# Patient Record
Sex: Male | Born: 1970 | Race: White | Hispanic: No | Marital: Single
Health system: Southern US, Community
[De-identification: ages and names within clinical notes are randomized; demographics above are authoritative.]

## PROBLEM LIST (undated history)

## (undated) DIAGNOSIS — K219 Gastro-esophageal reflux disease without esophagitis: Secondary | ICD-10-CM

## (undated) DIAGNOSIS — G4733 Obstructive sleep apnea (adult) (pediatric): Secondary | ICD-10-CM

## (undated) DIAGNOSIS — E785 Hyperlipidemia, unspecified: Secondary | ICD-10-CM

## (undated) HISTORY — PX: HEMORROIDECTOMY: SUR656

---

## 2019-05-28 ENCOUNTER — Emergency Department: Payer: 59

## 2019-05-28 ENCOUNTER — Other Ambulatory Visit: Payer: Self-pay

## 2019-05-28 ENCOUNTER — Emergency Department
Admission: EM | Admit: 2019-05-28 | Discharge: 2019-05-28 | Disposition: A | Discharge status: Home / Self Care | Payer: 59 | Attending: Student in an Organized Health Care Education/Training Program | Admitting: Student in an Organized Health Care Education/Training Program

## 2019-05-28 ENCOUNTER — Encounter: Payer: Self-pay | Admitting: Emergency Medicine

## 2019-05-28 DIAGNOSIS — R0602 Shortness of breath: Secondary | ICD-10-CM | POA: Diagnosis not present

## 2019-05-28 HISTORY — DX: Obstructive sleep apnea (adult) (pediatric): G47.33

## 2019-05-28 HISTORY — DX: Hyperlipidemia, unspecified: E78.5

## 2019-05-28 HISTORY — DX: Gastro-esophageal reflux disease without esophagitis: K21.9

## 2019-05-28 LAB — COMPREHENSIVE METABOLIC PANEL
ALT: 36 U/L (ref 0–44)
AST: 31 U/L (ref 15–41)
Albumin: 4.4 g/dL (ref 3.5–5.0)
Alkaline Phosphatase: 77 U/L (ref 38–126)
Anion gap: 9 (ref 5–15)
BUN: 12 mg/dL (ref 6–20)
CO2: 28 mmol/L (ref 22–32)
Calcium: 9.2 mg/dL (ref 8.9–10.3)
Chloride: 101 mmol/L (ref 98–111)
Creatinine, Ser: 0.82 mg/dL (ref 0.61–1.24)
GFR calc Af Amer: >60 mL/min (ref >60)
GFR calc non Af Amer: >60 mL/min (ref >60)
Glucose, Bld: 102 mg/dL — ABNORMAL HIGH (ref 70–99)
Potassium: 4.2 mmol/L (ref 3.5–5.1)
Sodium: 138 mmol/L (ref 135–145)
Total Bilirubin: 1.4 mg/dL — ABNORMAL HIGH (ref 0.3–1.2)
Total Protein: 8.2 g/dL — ABNORMAL HIGH (ref 6.5–8.1)

## 2019-05-28 LAB — URINALYSIS, COMPLETE (UACMP) WITH MICROSCOPIC
Bacteria, UA: NONE SEEN
Bilirubin Urine: NEGATIVE
Glucose, UA: NEGATIVE mg/dL
Hgb urine dipstick: NEGATIVE
Ketones, ur: NEGATIVE mg/dL
Leukocytes,Ua: NEGATIVE
Nitrite: NEGATIVE
Protein, ur: NEGATIVE mg/dL
Specific Gravity, Urine: 1.002 — ABNORMAL LOW (ref 1.005–1.030)
Squamous Epithelial / HPF: NONE SEEN (ref 0–5)
WBC, UA: NONE SEEN WBC/hpf (ref 0–5)
pH: 6 (ref 5.0–8.0)

## 2019-05-28 LAB — CBC WITH DIFFERENTIAL/PLATELET
Abs Immature Granulocytes: 0.03 10*3/uL (ref 0.00–0.07)
Basophils Absolute: 0 10*3/uL (ref 0.0–0.1)
Basophils Relative: 0 %
Eosinophils Absolute: 0.2 10*3/uL (ref 0.0–0.5)
Eosinophils Relative: 2 %
HCT: 48 % (ref 39.0–52.0)
Hemoglobin: 16.5 g/dL (ref 13.0–17.0)
Immature Granulocytes: 0 %
Lymphocytes Relative: 32 %
Lymphs Abs: 2.3 10*3/uL (ref 0.7–4.0)
MCH: 31.1 pg (ref 26.0–34.0)
MCHC: 34.4 g/dL (ref 30.0–36.0)
MCV: 90.4 fL (ref 80.0–100.0)
Monocytes Absolute: 0.7 10*3/uL (ref 0.1–1.0)
Monocytes Relative: 10 %
Neutro Abs: 4.1 10*3/uL (ref 1.7–7.7)
Neutrophils Relative %: 56 %
Platelets: 249 10*3/uL (ref 150–400)
RBC: 5.31 MIL/uL (ref 4.22–5.81)
RDW: 12.1 % (ref 11.5–15.5)
WBC: 7.3 10*3/uL (ref 4.0–10.5)
nRBC: 0 % (ref 0.0–0.2)

## 2019-05-28 LAB — TROPONIN I (HIGH SENSITIVITY)
Troponin I (High Sensitivity): 3 ng/L (ref <18)
Troponin I (High Sensitivity): 4 ng/L (ref <18)

## 2019-05-28 IMAGING — DX PORTABLE CHEST - 1 VIEW
1 series · 1 of 1 positions shown · non-contrast
Comparison: None.

CLINICAL DATA: Shortness of breath

EXAM:
PORTABLE CHEST 1 VIEW

[chest ap]
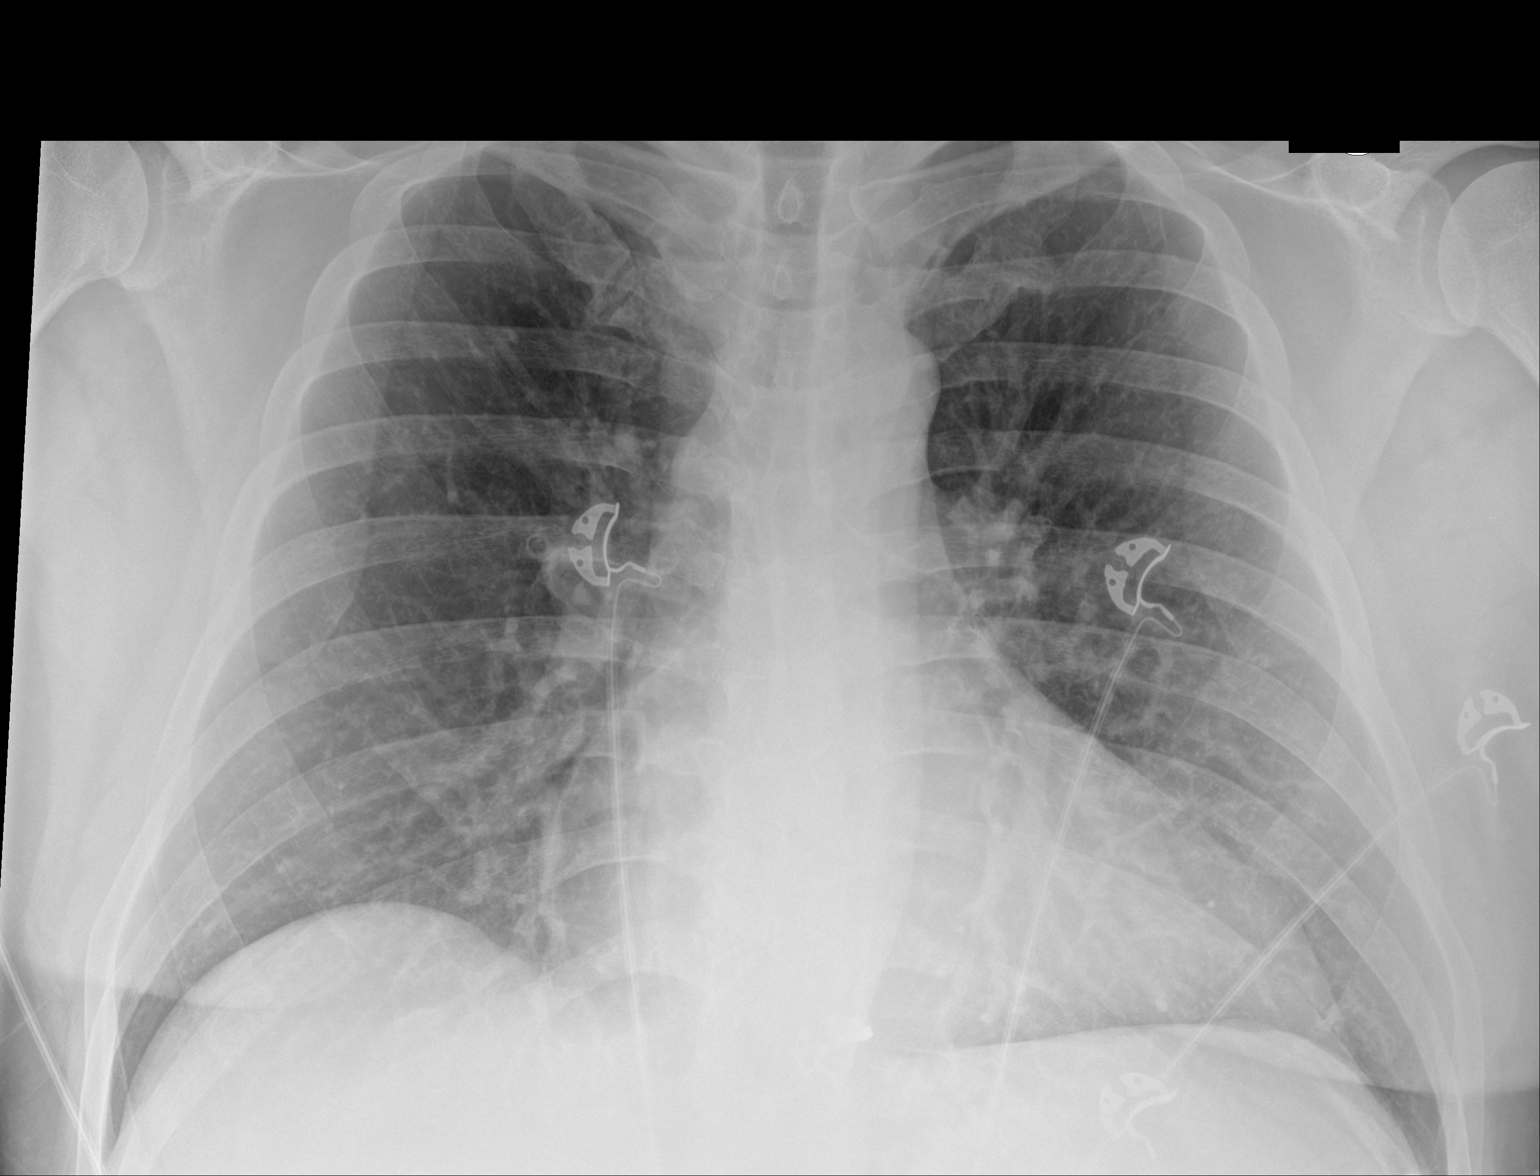

[1 of 1 positions shown; findings below may reference images not displayed]

FINDINGS: Normal heart size and mediastinal contours when accounting for
probable fat pad along the left heart border. No acute infiltrate or
edema. No effusion or pneumothorax. No acute osseous findings.
IMPRESSION: No active disease.

## 2019-05-28 NOTE — ED Provider Notes (Signed)
[facility] Emergency Department Provider Note    First MD Initiated Contact with Patient 05/28/19 [phone]     (approximate)  I have reviewed the triage vital signs and the nursing notes.   HISTORY  Chief Complaint Shortness of Breath    HPI Stephen Tapia is a 48 y.o. male history GERD hyperlipidemia and obstructive sleep apnea presents the ER for evaluation of shortness of breath that woke him from sleep.  States that he is traveling to the area to visit family he was staying overnight at a hotel.  Was using his CPAP as he usually does.  There were no alarm sounding.  States he did he will follow this before going to bed and does have a history of reflux waking him up in the middle the night.  He denied any chest pain or pressure.  No nausea or vomiting.  No fevers.  States that he felt like his heart was pounding when he woke up.  Denied any diaphoresis.    Past Medical History:  Diagnosis Date  . GERD (gastroesophageal reflux disease)   . Hyperlipidemia   . OSA (obstructive sleep apnea)    No family history on file.  There are no active problems to display for this patient.     Prior to Admission medications   Not on File    Allergies Patient has no allergy information on record.    Social History Social History   Tobacco Use  . Smoking status: Not on file  Substance Use Topics  . Alcohol use: Not on file  . Drug use: Not on file    Review of Systems Patient denies headaches, rhinorrhea, blurry vision, numbness, shortness of breath, chest pain, edema, cough, abdominal pain, nausea, vomiting, diarrhea, dysuria, fevers, rashes or hallucinations unless otherwise stated above in HPI. ____________________________________________   PHYSICAL EXAM:  VITAL SIGNS: Vitals:   05/28/19 0615 05/28/19 0630  BP:  136/87  Pulse: 83 75  Resp: 20 (!) 21  Temp:    SpO2: 96% 96%    Constitutional: Alert and oriented.  Eyes: Conjunctivae are  normal.  Head: Atraumatic. Nose: No congestion/rhinnorhea. Mouth/Throat: Mucous membranes are moist.   Neck: No stridor. Painless ROM.  Cardiovascular: Normal rate, regular rhythm. Grossly normal heart sounds.  Good peripheral circulation. Respiratory: Normal respiratory effort.  No retractions. Lungs CTAB. Gastrointestinal: Soft and nontender. No distention. No abdominal bruits. No CVA tenderness. Genitourinary: deferred Musculoskeletal: No lower extremity tenderness nor edema.  No joint effusions. Neurologic:  Normal speech and language. No gross focal neurologic deficits are appreciated. No facial droop Skin:  Skin is warm, dry and intact. No rash noted. Psychiatric: Mood and affect are normal. Speech and behavior are normal.  ____________________________________________   LABS (all labs ordered are listed, but only abnormal results are displayed)  Results for orders placed or performed during the hospital encounter of 05/28/19 (from the past 24 hour(s))  CBC with Differential/Platelet     Status: None   Collection Time: 05/28/19  4:23 AM  Result Value Ref Range   WBC 7.3 4.0 - 10.5 K/uL   RBC 5.31 4.22 - 5.81 MIL/uL   Hemoglobin 16.5 13.0 - 17.0 g/dL   HCT 96.048.0 45.439.0 - 09.852.0 %   MCV 90.4 80.0 - 100.0 fL   MCH 31.1 26.0 - 34.0 pg   MCHC 34.4 30.0 - 36.0 g/dL   RDW 11.912.1 14.711.5 - 82.915.5 %   Platelets 249 150 - 400 K/uL   nRBC 0.0  0.0 - 0.2 %   Neutrophils Relative % 56 %   Neutro Abs 4.1 1.7 - 7.7 K/uL   Lymphocytes Relative 32 %   Lymphs Abs 2.3 0.7 - 4.0 K/uL   Monocytes Relative 10 %   Monocytes Absolute 0.7 0.1 - 1.0 K/uL   Eosinophils Relative 2 %   Eosinophils Absolute 0.2 0.0 - 0.5 K/uL   Basophils Relative 0 %   Basophils Absolute 0.0 0.0 - 0.1 K/uL   Immature Granulocytes 0 %   Abs Immature Granulocytes 0.03 0.00 - 0.07 K/uL  Comprehensive metabolic panel     Status: Abnormal   Collection Time: 05/28/19  4:23 AM  Result Value Ref Range   Sodium 138 135 - 145 mmol/L    Potassium 4.2 3.5 - 5.1 mmol/L   Chloride 101 98 - 111 mmol/L   CO2 28 22 - 32 mmol/L   Glucose, Bld 102 (H) 70 - 99 mg/dL   BUN 12 6 - 20 mg/dL   Creatinine, Ser 1.610.82 0.61 - 1.24 mg/dL   Calcium 9.2 8.9 - 09.610.3 mg/dL   Total Protein 8.2 (H) 6.5 - 8.1 g/dL   Albumin 4.4 3.5 - 5.0 g/dL   AST 31 15 - 41 U/L   ALT 36 0 - 44 U/L   Alkaline Phosphatase 77 38 - 126 U/L   Total Bilirubin 1.4 (H) 0.3 - 1.2 mg/dL   GFR calc non Af Amer >60 >60 mL/min   GFR calc Af Amer >60 >60 mL/min   Anion gap 9 5 - 15  Troponin I (High Sensitivity)     Status: None   Collection Time: 05/28/19  4:23 AM  Result Value Ref Range   Troponin I (High Sensitivity) 3 <18 ng/L  Urinalysis, Complete w Microscopic     Status: Abnormal   Collection Time: 05/28/19  4:23 AM  Result Value Ref Range   Color, Urine STRAW (A) YELLOW   APPearance CLEAR (A) CLEAR   Specific Gravity, Urine 1.002 (L) 1.005 - 1.030   pH 6.0 5.0 - 8.0   Glucose, UA NEGATIVE NEGATIVE mg/dL   Hgb urine dipstick NEGATIVE NEGATIVE   Bilirubin Urine NEGATIVE NEGATIVE   Ketones, ur NEGATIVE NEGATIVE mg/dL   Protein, ur NEGATIVE NEGATIVE mg/dL   Nitrite NEGATIVE NEGATIVE   Leukocytes,Ua NEGATIVE NEGATIVE   WBC, UA NONE SEEN 0 - 5 WBC/hpf   Bacteria, UA NONE SEEN NONE SEEN   Squamous Epithelial / LPF NONE SEEN 0 - 5  Troponin I (High Sensitivity)     Status: None   Collection Time: 05/28/19  6:23 AM  Result Value Ref Range   Troponin I (High Sensitivity) 4 <18 ng/L   ____________________________________________  EKG My review and personal interpretation at Time: 4:20   Indication: sob  Rate: 95  Rhythm: sinus Axis: normal Other: iRBBB,, no stemi, nonspecific st abn ____________________________________________  RADIOLOGY  I personally reviewed all radiographic images ordered to evaluate for the above acute complaints and reviewed radiology reports and findings.  These findings were personally discussed with the patient.  Please see  medical record for radiology report.  ____________________________________________   PROCEDURES  Procedure(s) performed:  Procedures    Critical Care performed: no ____________________________________________   INITIAL IMPRESSION / ASSESSMENT AND PLAN / ED COURSE  Pertinent labs & imaging results that were available during my care of the patient were reviewed by me and considered in my medical decision making (see chart for details).   DDX: dysrhythmia, chf, acs, copd,  bronchitis, reflux  Stephen Tapia is a 48 y.o. who presents to the ED with symptoms as described above.  Patient well-appearing in no acute distress.  Have low suspicion for ACS.Marland Kitchen  Abdominal exam is soft and benign.  Will place on cardiac monitor a.  Will order serial enzymes.  He is otherwise low risk by heart score.  Clinical Course as of May 27 704  Fri May 28, 2019  0624 Initial troponin is normal.  Patient is low risk by Wells criteria and is PERC negative thus far work-up is reassuring.  Will repeat troponin.  Not having any evidence of dysrhythmia on telemetry.   [PR]  [phone] Patient remains hemodynamically stable.  No hypoxia.  No dysrhythmia.  Initial troponin negative.  We will follow-up on repeat troponin.  If this is negative anticipate patient will be appropriate for outpatient follow-up.   [PR]    Clinical Course User Index [PR] Merlyn Lot, MD    The patient was evaluated in Emergency Department today for the symptoms described in the history of present illness. He/she was evaluated in the context of the global COVID-19 pandemic, which necessitated consideration that the patient might be at risk for infection with the SARS-CoV-2 virus that causes COVID-19. Institutional protocols and algorithms that pertain to the evaluation of patients at risk for COVID-19 are in a state of rapid change based on information released by regulatory bodies including the CDC and federal and state organizations. These  policies and algorithms were followed during the patient's care in the ED.  As part of my medical decision making, I reviewed the following data within the [facility] notes reviewed and incorporated, Labs reviewed, notes from prior ED visits and [place] Controlled Substance Database   ____________________________________________   FINAL CLINICAL IMPRESSION(S) / ED DIAGNOSES  Final diagnoses:  SOB (shortness of breath)      NEW MEDICATIONS STARTED DURING THIS VISIT:  New Prescriptions   No medications on file     Note:  This document was prepared using Dragon voice recognition software and may include unintentional dictation errors.    Merlyn Lot, MD 05/28/19 [id number]

## 2019-05-28 NOTE — Discharge Instructions (Signed)
Please follow-up with your primary care physician.  Seek medical care if you develop any shortness of breath chest pain or pressure or for any additional questions or concerns.

## 2019-05-28 NOTE — ED Notes (Signed)
Called lab to check on troponin and CMP, lab said they will be ready in 63min

## 2019-05-28 NOTE — ED Triage Notes (Signed)
Patient arrives via EMS from hotel after experiencing SOB while sleeping on BiPAP. Patient visiting from [place], wears BiPAP for sleep apnea, was asleep on BiPAP when he woke up SOB, no alarms on BiPAP. Also feeling shaky. Reports feeling better currently, but got "sweaty and feeling lasted for several minutes after- felt lightheaded and dizzy". No chest pain
